# Patient Record
Sex: Female | Born: 1995 | Race: Black or African American | Hispanic: No | Marital: Single | Smoking: Current every day smoker
Health system: Southern US, Community
[De-identification: ages and names within clinical notes are randomized; demographics above are authoritative.]

## PROBLEM LIST (undated history)

## (undated) HISTORY — PX: OTHER SURGICAL HISTORY: SHX169

---
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
## 2017-01-28 ENCOUNTER — Encounter (HOSPITAL_COMMUNITY): Payer: Self-pay

## 2017-01-28 DIAGNOSIS — Z202 Contact with and (suspected) exposure to infections with a predominantly sexual mode of transmission: Secondary | ICD-10-CM | POA: Insufficient documentation

## 2017-01-28 DIAGNOSIS — F17200 Nicotine dependence, unspecified, uncomplicated: Secondary | ICD-10-CM | POA: Insufficient documentation

## 2017-01-28 DIAGNOSIS — N760 Acute vaginitis: Secondary | ICD-10-CM | POA: Insufficient documentation

## 2017-01-28 NOTE — ED Triage Notes (Signed)
Pt states that she thinks she has chlamydia, reports yellowish, foul odor discharge, itching and dysuria. Symptoms started three days ago along with back pain.

## 2017-01-29 ENCOUNTER — Emergency Department (HOSPITAL_COMMUNITY)
Admission: EM | Admit: 2017-01-29 | Discharge: 2017-01-29 | Disposition: A | Discharge status: Home / Self Care | Payer: Self-pay | Attending: Emergency Medicine | Admitting: Emergency Medicine

## 2017-01-29 DIAGNOSIS — Z202 Contact with and (suspected) exposure to infections with a predominantly sexual mode of transmission: Secondary | ICD-10-CM

## 2017-01-29 DIAGNOSIS — N760 Acute vaginitis: Secondary | ICD-10-CM

## 2017-01-29 DIAGNOSIS — B9689 Other specified bacterial agents as the cause of diseases classified elsewhere: Secondary | ICD-10-CM

## 2017-01-29 LAB — URINALYSIS, ROUTINE W REFLEX MICROSCOPIC
Bilirubin Urine: NEGATIVE
Glucose, UA: NEGATIVE mg/dL
Hgb urine dipstick: NEGATIVE
Ketones, ur: 5 mg/dL — AB
Leukocytes, UA: NEGATIVE
Nitrite: NEGATIVE
PROTEIN: 30 mg/dL — AB
Specific Gravity, Urine: 1.026 (ref 1.005–1.030)
pH: 7 (ref 5.0–8.0)

## 2017-01-29 LAB — WET PREP, GENITAL
SPERM: NONE SEEN
TRICH WET PREP: NONE SEEN
YEAST WET PREP: NONE SEEN

## 2017-01-29 LAB — RPR: RPR Ser Ql: NONREACTIVE

## 2017-01-29 LAB — GC/CHLAMYDIA PROBE AMP (~~LOC~~) NOT AT ARMC
Chlamydia: POSITIVE — AB
Neisseria Gonorrhea: POSITIVE — AB

## 2017-01-29 LAB — POC URINE PREG, ED: PREG TEST UR: NEGATIVE

## 2017-01-29 LAB — HIV ANTIBODY (ROUTINE TESTING W REFLEX): HIV Screen 4th Generation wRfx: NONREACTIVE

## 2017-01-29 MED ORDER — GENTAMICIN SULFATE 40 MG/ML IJ SOLN
240.0000 mg | Freq: Once | INTRAMUSCULAR | Status: AC
  Administered 2017-01-29: 240 mg via INTRAMUSCULAR
  Filled 2017-01-29: qty 6

## 2017-01-29 MED ORDER — AZITHROMYCIN 250 MG PO TABS
2000.0000 mg | ORAL_TABLET | Freq: Once | ORAL | Status: AC
  Administered 2017-01-29: 2000 mg via ORAL
  Filled 2017-01-29: qty 8

## 2017-01-29 MED ORDER — METRONIDAZOLE 500 MG PO TABS: 500.0000 mg | ORAL_TABLET | Freq: Two times a day (BID) | ORAL | 0 refills | Status: AC

## 2017-01-29 NOTE — ED Provider Notes (Signed)
[facility]-EMERGENCY DEPT Provider Note   CSN: [id number] Arrival date & time: 01/28/17  2314     History   Chief Complaint Chief Complaint  Patient presents with  . SEXUALLY TRANSMITTED DISEASE    HPI [name] is a 21 y.o. female.  The history is provided by the patient and medical records. No language interpreter was used.   [name] is an otherwise healthy 21 y.o. female  who presents to the Emergency Department complaining of persistent vaginal discharge x 4 days with associated foul odor and dysuria. Patient was called by recent partner tonight and told he tested positive for chlamydia. She is here requesting STD check. No abdominal pain, back pain, fevers/chills. No vaginal bleeding. LMP 2-3 weeks ago. No medications taken prior to arrival for symptoms.    History reviewed. No pertinent past medical history.  There are no active problems to display for this patient.   Past Surgical History:  Procedure Laterality Date  . kindey     pt has one kidney     OB History    No data available       Home Medications    Prior to Admission medications   Medication Sig Start Date End Date Taking? Authorizing Provider  metroNIDAZOLE (FLAGYL) 500 MG tablet Take 1 tablet (500 mg total) by mouth 2 (two) times daily. 01/29/17   Chase Picket Shelsy Seng, PA-C    Family History No family history on file.  Social History Social History  Substance Use Topics  . Smoking status: Current Every Day Smoker  . Smokeless tobacco: Never Used  . Alcohol use No     Allergies   Penicillins   Review of Systems Review of Systems  Constitutional: Negative for chills and fever.  HENT: Negative for congestion.   Eyes: Negative for visual disturbance.  Respiratory: Negative for cough and shortness of breath.   Cardiovascular: Negative for chest pain.  Gastrointestinal: Negative for abdominal pain, nausea and vomiting.  Genitourinary: Positive for dysuria and vaginal discharge.  Negative for vaginal bleeding.  Musculoskeletal: Negative for back pain and neck pain.  Skin: Negative for rash.  Neurological: Negative for headaches.     Physical Exam Updated Vital Signs BP (!) 107/55 (BP Location: Left Arm)   Pulse 89   Temp 98.3 F (36.8 C) (Oral)   Resp 16   Ht  (1.6 m)   Wt 61.7 kg   LMP 01/07/2017   SpO2 100%   BMI 24.09 kg/m   Physical Exam  Constitutional: She is oriented to person, place, and time. She appears well-developed and well-nourished. No distress.  HENT:  Head: Normocephalic and atraumatic.  Cardiovascular: Normal rate, regular rhythm and normal heart sounds.   No murmur heard. Pulmonary/Chest: Effort normal and breath sounds normal. No respiratory distress.  Abdominal: Soft. Bowel sounds are normal. She exhibits no distension.  No abdominal or CVA tenderness.   Genitourinary:  Genitourinary Comments: Chaperone present for exam.  No rashes, lesions, or tenderness to external genitalia. + discharge.  No CMT. No  adnexal masses, tenderness, or fullness.  No bleeding within vaginal vault.  Neurological: She is alert and oriented to person, place, and time.  Skin: Skin is warm and dry.  Nursing note and vitals reviewed.    ED Treatments / Results  Labs (all labs ordered are listed, but only abnormal results are displayed) Labs Reviewed  WET PREP, GENITAL - Abnormal; Notable for the following:       Result Value  Clue Cells Wet Prep HPF POC PRESENT (*)    WBC, Wet Prep HPF POC MANY (*)    All other components within normal limits  URINALYSIS, ROUTINE W REFLEX MICROSCOPIC - Abnormal; Notable for the following:    APPearance HAZY (*)    Ketones, ur 5 (*)    Protein, ur 30 (*)    Bacteria, UA RARE (*)    Squamous Epithelial / LPF 0-5 (*)    All other components within normal limits  RPR  HIV ANTIBODY (ROUTINE TESTING)  POC URINE PREG, ED  GC/CHLAMYDIA PROBE AMP ([place]) NOT AT [facility]    EKG  EKG  Interpretation None       Radiology No results found.  Procedures Procedures (including critical care time)  Medications Ordered in ED Medications  gentamicin (GARAMYCIN) injection 240 mg (not administered)  azithromycin (ZITHROMAX) tablet 2,000 mg (not administered)     Initial Impression / Assessment and Plan / ED Course  I have reviewed the triage vital signs and the nursing notes.  Pertinent labs & imaging results that were available during my care of the patient were reviewed by me and considered in my medical decision making (see chart for details).    [name] is a 21 y.o. female who presents to ED for vaginal discharge and dysuria x 4 days. She was called tonight by recent sexual partner and informed he tested positive for chlamydia. Pelvic with vaginal discharge but no CMT or adnexal tenderness. Benign abdominal exam. UA non-infectious. Wet prep with clue cells and many WBC's. Will treat BV with flagyl. Pharmacy consulted for assistance with ABX selection for G&C as patient has anaphylactic reaction to penicillins in the past. Pharmacist, Clydie Braun, recommends gentamicin 240 mg IM and azithromycin 2 g orally once for prophylactics gonorrhea and chlamydia treatment which was given. OBGYN or health department follow up encouraged. G&C, HIV, RPR obtained and patient aware she will be notified if results are positive. All questions answered.   Final Clinical Impressions(s) / ED Diagnoses   Final diagnoses:  STD exposure  BV (bacterial vaginosis)    New Prescriptions New Prescriptions   METRONIDAZOLE (FLAGYL) 500 MG TABLET    Take 1 tablet (500 mg total) by mouth 2 (two) times daily.     [facility] Camala Talwar, PA-C 01/29/17 7829    Glynn Octave, MD 01/29/17 [phone]

## 2017-01-29 NOTE — Discharge Instructions (Signed)
It was my pleasure taking care of you today!  Please take all of your antibiotics until finished! Please do not drink alcohol on this medication. Use a condom with every sexual encounter Follow up with your regular doctor, your OBGYN or the health department in regards to today's visit. Please return to the ER for worsening symptoms, high fevers or persistent vomiting.  You have been tested for HIV, syphilis, chlamydia and gonorrhea. These results will be available in approximately 3 days. You will be notified if they are positive.   It is very important to practice safe sex and use condoms when sexually active. If your results are positive you need to notify all sexual partners so they can be treated as well. The website https://garcia.net/ can be used to send anonymous text messages or emails to alert sexual contacts.   SEEK IMMEDIATE MEDICAL CARE IF:  You develop an oral temperature above 102 F (38.9 C), not controlled by medications or lasting more than 2 days.  You develop an increase in pain.  You develop vaginal bleeding and it is not time for your period.  You develop painful intercourse.

## 2019-08-24 ENCOUNTER — Encounter (HOSPITAL_COMMUNITY): Payer: Self-pay | Admitting: Emergency Medicine

## 2019-08-24 ENCOUNTER — Emergency Department (HOSPITAL_COMMUNITY)
Admission: EM | Admit: 2019-08-24 | Discharge: 2019-08-25 | Discharge status: Against Medical Advice | Payer: Medicaid Other | Attending: Emergency Medicine | Admitting: Emergency Medicine

## 2019-08-24 DIAGNOSIS — Z5321 Procedure and treatment not carried out due to patient leaving prior to being seen by health care provider: Secondary | ICD-10-CM | POA: Insufficient documentation

## 2019-08-24 DIAGNOSIS — R1032 Left lower quadrant pain: Secondary | ICD-10-CM | POA: Diagnosis present

## 2019-08-24 LAB — I-STAT BETA HCG BLOOD, ED (MC, WL, AP ONLY): I-stat hCG, quantitative: >2000 m[IU]/mL — ABNORMAL HIGH (ref <5)

## 2019-08-24 LAB — URINALYSIS, ROUTINE W REFLEX MICROSCOPIC
Bilirubin Urine: NEGATIVE
Glucose, UA: NEGATIVE mg/dL
Hgb urine dipstick: NEGATIVE
Ketones, ur: NEGATIVE mg/dL
Nitrite: NEGATIVE
Protein, ur: NEGATIVE mg/dL
Specific Gravity, Urine: 1.016 (ref 1.005–1.030)
pH: 5 (ref 5.0–8.0)

## 2019-08-24 LAB — BASIC METABOLIC PANEL
Anion gap: 6 (ref 5–15)
BUN: 6 mg/dL (ref 6–20)
CO2: 23 mmol/L (ref 22–32)
Calcium: 9.2 mg/dL (ref 8.9–10.3)
Chloride: 108 mmol/L (ref 98–111)
Creatinine, Ser: 0.75 mg/dL (ref 0.44–1.00)
GFR calc Af Amer: >60 mL/min (ref >60)
GFR calc non Af Amer: >60 mL/min (ref >60)
Glucose, Bld: 93 mg/dL (ref 70–99)
Potassium: 3.8 mmol/L (ref 3.5–5.1)
Sodium: 137 mmol/L (ref 135–145)

## 2019-08-24 LAB — ABO/RH: ABO/RH(D): O POS

## 2019-08-24 LAB — CBC
HCT: 39.6 % (ref 36.0–46.0)
Hemoglobin: 12.7 g/dL (ref 12.0–15.0)
MCH: 26.6 pg (ref 26.0–34.0)
MCHC: 32.1 g/dL (ref 30.0–36.0)
MCV: 83 fL (ref 80.0–100.0)
Platelets: 370 10*3/uL (ref 150–400)
RBC: 4.77 MIL/uL (ref 3.87–5.11)
RDW: 15.2 % (ref 11.5–15.5)
WBC: 6.9 10*3/uL (ref 4.0–10.5)
nRBC: 0 % (ref 0.0–0.2)

## 2019-08-24 NOTE — ED Notes (Signed)
Attempted to contact pt with phone number listed in the chart x 2 without success
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
